# Patient Record
Sex: Male | Born: 1960 | Race: White | Hispanic: No | Marital: Married | Smoking: Never smoker
Health system: Southern US, Community
[De-identification: ages and names within clinical notes are randomized; demographics above are authoritative.]

## PROBLEM LIST (undated history)

## (undated) DIAGNOSIS — K219 Gastro-esophageal reflux disease without esophagitis: Secondary | ICD-10-CM

## (undated) DIAGNOSIS — I719 Aortic aneurysm of unspecified site, without rupture: Secondary | ICD-10-CM

## (undated) DIAGNOSIS — I639 Cerebral infarction, unspecified: Secondary | ICD-10-CM

## (undated) DIAGNOSIS — N289 Disorder of kidney and ureter, unspecified: Secondary | ICD-10-CM

## (undated) DIAGNOSIS — I10 Essential (primary) hypertension: Secondary | ICD-10-CM

## (undated) DIAGNOSIS — I2510 Atherosclerotic heart disease of native coronary artery without angina pectoris: Secondary | ICD-10-CM

## (undated) HISTORY — PX: CORONARY ANEURYSM REPAIR: SHX603

## (undated) HISTORY — PX: AORTIC VALVE REPAIR: SHX6306

## (undated) HISTORY — PX: CARDIAC SURGERY: SHX584

---

## 1999-06-18 ENCOUNTER — Emergency Department (HOSPITAL_COMMUNITY): Admission: EM | Admit: 1999-06-18 | Discharge: 1999-06-18 | Payer: Self-pay | Admitting: Emergency Medicine

## 2009-06-29 ENCOUNTER — Emergency Department (HOSPITAL_BASED_OUTPATIENT_CLINIC_OR_DEPARTMENT_OTHER): Admission: EM | Admit: 2009-06-29 | Discharge: 2009-06-29 | Payer: Self-pay | Admitting: Emergency Medicine

## 2009-06-29 ENCOUNTER — Ambulatory Visit: Payer: Self-pay | Admitting: Diagnostic Radiology

## 2009-07-17 ENCOUNTER — Ambulatory Visit (HOSPITAL_COMMUNITY): Admission: RE | Admit: 2009-07-17 | Discharge: 2009-07-17 | Payer: Self-pay | Admitting: Cardiology

## 2009-07-17 ENCOUNTER — Encounter (INDEPENDENT_AMBULATORY_CARE_PROVIDER_SITE_OTHER): Payer: Self-pay | Admitting: Cardiology

## 2009-07-17 ENCOUNTER — Ambulatory Visit (HOSPITAL_COMMUNITY): Admission: RE | Admit: 2009-07-17 | Discharge: 2009-07-17 | Payer: Self-pay | Admitting: Cardiovascular Disease

## 2010-12-09 ENCOUNTER — Emergency Department (HOSPITAL_COMMUNITY): Payer: 59

## 2010-12-09 ENCOUNTER — Emergency Department (HOSPITAL_COMMUNITY)
Admission: EM | Admit: 2010-12-09 | Discharge: 2010-12-09 | Disposition: A | Discharge status: Home / Self Care | Payer: 59 | Attending: Emergency Medicine | Admitting: Emergency Medicine

## 2010-12-09 DIAGNOSIS — I359 Nonrheumatic aortic valve disorder, unspecified: Secondary | ICD-10-CM | POA: Insufficient documentation

## 2010-12-09 DIAGNOSIS — I10 Essential (primary) hypertension: Secondary | ICD-10-CM | POA: Insufficient documentation

## 2010-12-09 DIAGNOSIS — K219 Gastro-esophageal reflux disease without esophagitis: Secondary | ICD-10-CM | POA: Insufficient documentation

## 2010-12-09 DIAGNOSIS — R42 Dizziness and giddiness: Secondary | ICD-10-CM | POA: Insufficient documentation

## 2010-12-09 DIAGNOSIS — I498 Other specified cardiac arrhythmias: Secondary | ICD-10-CM | POA: Insufficient documentation

## 2010-12-09 LAB — BASIC METABOLIC PANEL
BUN: 14 mg/dL (ref 6–23)
CO2: 27 mEq/L (ref 19–32)
Calcium: 9.8 mg/dL (ref 8.4–10.5)
Chloride: 102 mEq/L (ref 96–112)
Creatinine, Ser: 1.3 mg/dL (ref 0.4–1.5)

## 2010-12-09 LAB — DIFFERENTIAL
Basophils Absolute: 0 10*3/uL (ref 0.0–0.1)
Basophils Relative: 0 % (ref 0–1)
Eosinophils Absolute: 0.1 10*3/uL (ref 0.0–0.7)
Monocytes Relative: 7 % (ref 3–12)
Neutrophils Relative %: 69 % (ref 43–77)

## 2010-12-09 LAB — CBC
MCH: 31.3 pg (ref 26.0–34.0)
Platelets: 212 10*3/uL (ref 150–400)
RBC: 5.02 MIL/uL (ref 4.22–5.81)

## 2010-12-09 LAB — POCT CARDIAC MARKERS
Myoglobin, poc: 58.6 ng/mL (ref 12–200)
Troponin i, poc: <0.05 ng/mL (ref 0.00–0.09)

## 2011-02-05 LAB — POCT I-STAT 3, ART BLOOD GAS (G3+)
Acid-Base Excess: 1 mmol/L (ref 0.0–2.0)
O2 Saturation: 95 %
TCO2: 28 mmol/L (ref 0–100)

## 2011-02-05 LAB — BASIC METABOLIC PANEL
CO2: 26 mEq/L (ref 19–32)
Calcium: 9.8 mg/dL (ref 8.4–10.5)
Creatinine, Ser: 1.07 mg/dL (ref 0.4–1.5)
GFR calc Af Amer: >60 mL/min (ref >60)
Glucose, Bld: 123 mg/dL — ABNORMAL HIGH (ref 70–99)

## 2011-02-05 LAB — PROTIME-INR
INR: 1 (ref 0.00–1.49)
Prothrombin Time: 13.4 seconds (ref 11.6–15.2)

## 2011-02-05 LAB — CBC
MCHC: 34.4 g/dL (ref 30.0–36.0)
RDW: 12.6 % (ref 11.5–15.5)

## 2011-02-05 LAB — POCT I-STAT 3, VENOUS BLOOD GAS (G3P V): pH, Ven: 7.366 — ABNORMAL HIGH (ref 7.250–7.300)

## 2011-02-06 LAB — BASIC METABOLIC PANEL
Chloride: 103 mEq/L (ref 96–112)
Creatinine, Ser: 1.3 mg/dL (ref 0.4–1.5)
GFR calc Af Amer: >60 mL/min (ref >60)
Potassium: 4.2 mEq/L (ref 3.5–5.1)

## 2011-02-06 LAB — CBC
MCV: 89.3 fL (ref 78.0–100.0)
RBC: 5.07 MIL/uL (ref 4.22–5.81)
WBC: 8.9 10*3/uL (ref 4.0–10.5)

## 2011-02-06 LAB — DIFFERENTIAL
Eosinophils Absolute: 0.2 10*3/uL (ref 0.0–0.7)
Lymphs Abs: 1.4 10*3/uL (ref 0.7–4.0)
Monocytes Relative: 7 % (ref 3–12)
Neutrophils Relative %: 75 % (ref 43–77)

## 2011-02-06 LAB — POCT CARDIAC MARKERS
CKMB, poc: <1 ng/mL — ABNORMAL LOW (ref 1.0–8.0)
Myoglobin, poc: 56.4 ng/mL (ref 12–200)
Troponin i, poc: <0.05 ng/mL (ref 0.00–0.09)

## 2018-01-30 ENCOUNTER — Emergency Department (HOSPITAL_BASED_OUTPATIENT_CLINIC_OR_DEPARTMENT_OTHER)
Admission: EM | Admit: 2018-01-30 | Discharge: 2018-01-30 | Disposition: A | Discharge status: Home / Self Care | Payer: 59 | Attending: Emergency Medicine | Admitting: Emergency Medicine

## 2018-01-30 ENCOUNTER — Emergency Department (HOSPITAL_BASED_OUTPATIENT_CLINIC_OR_DEPARTMENT_OTHER): Payer: 59

## 2018-01-30 ENCOUNTER — Other Ambulatory Visit: Payer: Self-pay

## 2018-01-30 ENCOUNTER — Encounter (HOSPITAL_BASED_OUTPATIENT_CLINIC_OR_DEPARTMENT_OTHER): Payer: Self-pay | Admitting: Emergency Medicine

## 2018-01-30 DIAGNOSIS — R011 Cardiac murmur, unspecified: Secondary | ICD-10-CM | POA: Diagnosis not present

## 2018-01-30 DIAGNOSIS — R079 Chest pain, unspecified: Secondary | ICD-10-CM | POA: Diagnosis present

## 2018-01-30 DIAGNOSIS — Z952 Presence of prosthetic heart valve: Secondary | ICD-10-CM | POA: Diagnosis not present

## 2018-01-30 DIAGNOSIS — R0602 Shortness of breath: Secondary | ICD-10-CM | POA: Diagnosis not present

## 2018-01-30 DIAGNOSIS — R51 Headache: Secondary | ICD-10-CM | POA: Insufficient documentation

## 2018-01-30 DIAGNOSIS — Z79899 Other long term (current) drug therapy: Secondary | ICD-10-CM | POA: Diagnosis not present

## 2018-01-30 DIAGNOSIS — Z8673 Personal history of transient ischemic attack (TIA), and cerebral infarction without residual deficits: Secondary | ICD-10-CM | POA: Diagnosis not present

## 2018-01-30 DIAGNOSIS — Z7982 Long term (current) use of aspirin: Secondary | ICD-10-CM | POA: Insufficient documentation

## 2018-01-30 DIAGNOSIS — I10 Essential (primary) hypertension: Secondary | ICD-10-CM | POA: Diagnosis not present

## 2018-01-30 DIAGNOSIS — R61 Generalized hyperhidrosis: Secondary | ICD-10-CM | POA: Insufficient documentation

## 2018-01-30 DIAGNOSIS — K5900 Constipation, unspecified: Secondary | ICD-10-CM | POA: Diagnosis not present

## 2018-01-30 DIAGNOSIS — I2510 Atherosclerotic heart disease of native coronary artery without angina pectoris: Secondary | ICD-10-CM | POA: Diagnosis not present

## 2018-01-30 DIAGNOSIS — Z7901 Long term (current) use of anticoagulants: Secondary | ICD-10-CM | POA: Diagnosis not present

## 2018-01-30 DIAGNOSIS — R519 Headache, unspecified: Secondary | ICD-10-CM

## 2018-01-30 HISTORY — DX: Aortic aneurysm of unspecified site, without rupture: I71.9

## 2018-01-30 HISTORY — DX: Atherosclerotic heart disease of native coronary artery without angina pectoris: I25.10

## 2018-01-30 HISTORY — DX: Gastro-esophageal reflux disease without esophagitis: K21.9

## 2018-01-30 HISTORY — DX: Disorder of kidney and ureter, unspecified: N28.9

## 2018-01-30 HISTORY — DX: Essential (primary) hypertension: I10

## 2018-01-30 HISTORY — DX: Cerebral infarction, unspecified: I63.9

## 2018-01-30 LAB — TROPONIN I: Troponin I: <0.03 ng/mL (ref <0.03)

## 2018-01-30 LAB — HEPATIC FUNCTION PANEL
ALBUMIN: 4.5 g/dL (ref 3.5–5.0)
ALT: 36 U/L (ref 17–63)
AST: 30 U/L (ref 15–41)
Alkaline Phosphatase: 75 U/L (ref 38–126)
Bilirubin, Direct: 0.1 mg/dL (ref 0.1–0.5)
Indirect Bilirubin: 0.7 mg/dL (ref 0.3–0.9)
TOTAL PROTEIN: 7.8 g/dL (ref 6.5–8.1)
Total Bilirubin: 0.8 mg/dL (ref 0.3–1.2)

## 2018-01-30 LAB — BASIC METABOLIC PANEL
Anion gap: 8 (ref 5–15)
BUN: 15 mg/dL (ref 6–20)
CALCIUM: 9.7 mg/dL (ref 8.9–10.3)
CO2: 23 mmol/L (ref 22–32)
Chloride: 105 mmol/L (ref 101–111)
Creatinine, Ser: 1.05 mg/dL (ref 0.61–1.24)
Glucose, Bld: 100 mg/dL — ABNORMAL HIGH (ref 65–99)
Potassium: 4.3 mmol/L (ref 3.5–5.1)
Sodium: 136 mmol/L (ref 135–145)

## 2018-01-30 LAB — CBC
HCT: 47.1 % (ref 39.0–52.0)
Hemoglobin: 16 g/dL (ref 13.0–17.0)
MCH: 30.3 pg (ref 26.0–34.0)
MCHC: 34 g/dL (ref 30.0–36.0)
MCV: 89.2 fL (ref 78.0–100.0)
Platelets: 206 10*3/uL (ref 150–400)
RBC: 5.28 MIL/uL (ref 4.22–5.81)
RDW: 13.3 % (ref 11.5–15.5)
WBC: 8.3 10*3/uL (ref 4.0–10.5)

## 2018-01-30 LAB — PROTIME-INR
INR: 2.35
PROTHROMBIN TIME: 25.6 s — AB (ref 11.4–15.2)

## 2018-01-30 LAB — LIPASE, BLOOD: Lipase: 30 U/L (ref 11–51)

## 2018-01-30 MED ORDER — IOPAMIDOL (ISOVUE-370) INJECTION 76%
100.0000 mL | Freq: Once | INTRAVENOUS | Status: AC | PRN
  Administered 2018-01-30: 100 mL via INTRAVENOUS

## 2018-01-30 MED ORDER — PROCHLORPERAZINE EDISYLATE 5 MG/ML IJ SOLN
10.0000 mg | Freq: Once | INTRAMUSCULAR | Status: AC
  Administered 2018-01-30: 10 mg via INTRAVENOUS
  Filled 2018-01-30: qty 2

## 2018-01-30 MED ORDER — DIPHENHYDRAMINE HCL 50 MG/ML IJ SOLN
25.0000 mg | Freq: Once | INTRAMUSCULAR | Status: AC
  Administered 2018-01-30: 25 mg via INTRAVENOUS
  Filled 2018-01-30: qty 1

## 2018-01-30 NOTE — ED Notes (Signed)
ED Provider at bedside. 

## 2018-01-30 NOTE — Discharge Instructions (Signed)
Your workup today did not show convincing evidence of a cardiac cause of your chest pain given your reassuring CT scan of your chest, lab testing, and EKG that cardiology reviewed.  After speaking with her cardiology team, they felt you are safe for discharge home to continue outpatient workup by your [facility] cardiology team.  Please do not exert yourself too hard and follow-up with them in the next few days.  Please stay hydrated.  If any symptoms change or worsen, please return to the nearest emergency department immediately.

## 2018-01-30 NOTE — ED Provider Notes (Signed)
[facility] EMERGENCY DEPARTMENT Provider Note   CSN: [id number] Arrival date & time: 01/30/18  1003     History   Chief Complaint Chief Complaint  Patient presents with  . Chest Pain    HPI Xavier Roman is a 57 y.o. male.  The history is provided by the patient and the spouse.  Chest Pain   This is a recurrent problem. The current episode started more than 2 days ago. The problem occurs constantly. The problem has not changed since onset.The pain is associated with exertion. The pain is present in the substernal region. The pain is moderate. The quality of the pain is described as exertional, heavy and pressure-like. The pain does not radiate. The symptoms are aggravated by exertion. Associated symptoms include diaphoresis, exertional chest pressure, headaches and shortness of breath. Pertinent negatives include no abdominal pain, no cough, no fever, no irregular heartbeat, no lower extremity edema, no nausea, no near-syncope, no palpitations, no sputum production, no vomiting and no weakness. He has tried nothing for the symptoms. The treatment provided no relief. Risk factors include male gender.  His past medical history is significant for CAD and strokes.    Past Medical History:  Diagnosis Date  . Coronary artery disease   . GERD (gastroesophageal reflux disease)   . Hypertension   . Renal disorder    kideny stones  . Stroke [facility](HCC)     There are no active problems to display for this patient.   Past Surgical History:  Procedure Laterality Date  . CARDIAC SURGERY          Home Medications    Prior to Admission medications   Medication Sig Start Date End Date Taking? Authorizing Provider  aspirin 81 MG chewable tablet Chew by mouth daily.   Yes [provider]  esomeprazole (NEXIUM) 40 MG capsule Take 40 mg by mouth daily at 12 noon.   Yes [provider]  ezetimibe-simvastatin (VYTORIN) 10-20 MG tablet Take 1 tablet by mouth daily.    Yes [provider]  losartan (COZAAR) 50 MG tablet Take 50 mg by mouth daily.   Yes [provider]  metoprolol succinate (TOPROL-XL) 50 MG 24 hr tablet Take 50 mg by mouth daily. Take with or immediately following a meal.   Yes [provider]  warfarin (COUMADIN) 4 MG tablet Take 4 mg by mouth daily.   Yes [provider]    Family History History reviewed. No pertinent family history.  Social History Social History   Tobacco Use  . Smoking status: Never Smoker  . Smokeless tobacco: Never Used  Substance Use Topics  . Alcohol use: Never    Frequency: Never  . Drug use: Never     Allergies   Patient has no known allergies.   Review of Systems Review of Systems  Constitutional: Positive for diaphoresis. Negative for chills, fatigue and fever.  HENT: Negative for congestion.   Eyes: Negative for visual disturbance.  Respiratory: Positive for chest tightness and shortness of breath. Negative for cough, sputum production, wheezing and stridor.   Cardiovascular: Positive for chest pain. Negative for palpitations, leg swelling and near-syncope.  Gastrointestinal: Negative for abdominal pain, constipation, diarrhea, nausea and vomiting.  Genitourinary: Negative for dysuria and flank pain.  Musculoskeletal: Negative for neck pain and neck stiffness.  Skin: Negative for wound.  Neurological: Positive for headaches. Negative for weakness and light-headedness.  Psychiatric/Behavioral: Negative for agitation and confusion.  All other systems reviewed and are negative.  Physical Exam Updated Vital Signs BP (!) 138/92 (BP Location: Left Arm)   Pulse 83   Temp 97.9 F (36.6 C) (Oral)   Resp 20   Ht 5\' 11"  (1.803 m)   Wt 88 kg (194 lb)   SpO2 100%   BMI 27.06 kg/m   Physical Exam  Constitutional: He is oriented to person, place, and time. He appears well-developed and well-nourished.  Non-toxic appearance. He does not appear ill. No  distress.  HENT:  Head: Normocephalic.  Mouth/Throat: Oropharynx is clear and moist. No oropharyngeal exudate.  Eyes: Pupils are equal, round, and reactive to light. EOM are normal.  Neck: Normal range of motion. Neck supple.  Cardiovascular: Normal rate and regular rhythm.  Murmur heard. Pulmonary/Chest: Effort normal and breath sounds normal. No respiratory distress. He has no decreased breath sounds. He has no wheezes. He has no rhonchi. He has no rales.  Abdominal: Soft. There is no tenderness.  Musculoskeletal: He exhibits no edema or tenderness.  Lymphadenopathy:    He has no cervical adenopathy.  Neurological: He is alert and oriented to person, place, and time. No cranial nerve deficit or sensory deficit. He exhibits normal muscle tone. Coordination normal.  Skin: Capillary refill takes less than 2 seconds. No erythema. No pallor.  Psychiatric: He has a normal mood and affect.  Nursing note and vitals reviewed.    ED Treatments / Results  Labs (all labs ordered are listed, but only abnormal results are displayed) Labs Reviewed  BASIC METABOLIC PANEL - Abnormal; Notable for the following components:      Result Value   Glucose, Bld 100 (*)    All other components within normal limits  PROTIME-INR - Abnormal; Notable for the following components:   Prothrombin Time 25.6 (*)    All other components within normal limits  CBC  TROPONIN I  HEPATIC FUNCTION PANEL  LIPASE, BLOOD  TROPONIN I    EKG EKG Interpretation  Date/Time:  Monday January 30 2018 10:11:41 EDT Ventricular Rate:  74 PR Interval:  168 QRS Duration: 82 QT Interval:  364 QTC Calculation: 404 R Axis:   -2 Text Interpretation:  Normal sinus rhythm Normal ECG When compared to prior, no significant changes seen.  No STEMI Confirmed by Theda Belfast ([id number]) on 01/30/2018 10:44:20 AM   Radiology Dg Chest 2 View  Result Date: 01/30/2018 CLINICAL DATA:  Intermittent headache and nose bleeds. Intermittent  chest pain. EXAM: CHEST - 2 VIEW COMPARISON:  Single-view of the chest 07/17/2009. PA and lateral chest 06/29/2009. FINDINGS: The patient has undergone median sternotomy since the prior examination. Surgical clips are also seen projecting in the right upper chest. The lungs are clear. Heart size is normal. There is no pneumothorax or pleural effusion. No acute bony abnormality. IMPRESSION: No acute disease. Electronically Signed   By: Drusilla Kanner M.D.   On: 01/30/2018 10:35   Ct Head Wo Contrast  Result Date: 01/30/2018 CLINICAL DATA:  Patient states that has had pressure and burning of mid to left side of his chest, hx of aortic valve replaced and aneurysm repaired, states that he started with dull headaches to mid frontal up to vertex of head x 1.5 weeks, states the first 2 days of the headaches he had a few nosebleeds, hx of stroke 02/02/17, also has a known clot in his head, renal disorder, GERD, coronary artery disease, no other complaints EXAM: CT HEAD WITHOUT CONTRAST TECHNIQUE: Contiguous axial images were obtained from the base of the skull through  the vertex without intravenous contrast. COMPARISON:  02/02/2017 FINDINGS: Brain: There is a well-defined area of hypoattenuation along the posterior left frontal lobe consistent with an old infarct, although this is a new finding since the prior head CT. There are no parenchymal masses or mass effect. There is no evidence of a recent infarct. There are no extra-axial masses or abnormal fluid collections. The ventricles are normal in size and configuration. There is no intracranial hemorrhage. Vascular: No hyperdense vessel or unexpected calcification. Skull: Normal. Negative for fracture or focal lesion. Sinuses/Orbits: Visualized globes and orbits are unremarkable. The visualized sinuses and mastoid air cells are clear. Other: None. IMPRESSION: 1. No acute intracranial abnormalities. 2. Chronic, small left posterior frontal lobe infarct, new since the  prior CT. Electronically Signed   By: Amie Portland M.D.   On: 01/30/2018 12:30   Ct Angio Chest/abd/pel For Dissection W And/or Wo Contrast  Result Date: 01/30/2018 CLINICAL DATA:  Chest pain, pressure and burning at mid to LEFT chest, prior aneurysm repair and AVR, headaches, epistaxis, history of stroke, coronary artery disease, hypertension, GERD EXAM: CT ANGIOGRAPHY CHEST, ABDOMEN AND PELVIS TECHNIQUE: Multidetector CT imaging through the chest, abdomen and pelvis was performed using the standard protocol during bolus administration of intravenous contrast. Multiplanar reconstructed images and MIPs were obtained and reviewed to evaluate the vascular anatomy. CONTRAST:  ISOVUE-370 IOPAMIDOL (ISOVUE-370) INJECTION 76% IV COMPARISON:  None FINDINGS: CTA CHEST FINDINGS Cardiovascular: Scattered atherosclerotic calcifications aorta and coronary arteries. No intramural hematoma identified on precontrast imaging. Prior AVR. No pericardial effusion. Normal aortic enhancement following contrast without dissection. Proximal great vessels normal appearance. Pulmonary arteries grossly patent on nondedicated exam. Mediastinum/Nodes: Base of cervical region normal appearance. Esophagus normal appearance. No thoracic adenopathy. Lungs/Pleura: Tiny 1-2 mm LEFT lower lobe nodule image 45. Single tiny focus of subsegmental atelectasis in LEFT lower lobe. Remaining lungs clear. No infiltrate, pleural effusion or pneumothorax. No endobronchial lesions seen. Musculoskeletal: No acute osseous findings Review of the MIP images confirms the above findings. CTA ABDOMEN AND PELVIS FINDINGS VASCULAR Aorta: Normal caliber with minimal scattered atherosclerotic calcifications. No aneurysm or dissection. Celiac: Patent, unremarkable SMA: Patent, unremarkable Renals: Mild plaque formation at origin of RIGHT renal artery, estimated 50% narrowing. Accessory renal artery to upper pole RIGHT kidney. IMA: Significant calcific plaque at  the origin of the IMA with high-grade narrowing Inflow: Scattered atherosclerotic plaques in the common iliac arteries without aneurysm Veins: Unopacified on CTA imaging, grossly normal caliber Review of the MIP images confirms the above findings. NON-VASCULAR Hepatobiliary: Gallbladder and liver unremarkable Pancreas: Normal appearance Spleen: Normal appearance Adrenals/Urinary Tract: Adrenal glands normal appearance. Multiple small non-obstructing calculi within RIGHT kidney. No definite renal mass, hydronephrosis or hydroureter. Bladder unremarkable Stomach/Bowel: Minimal distal colonic diverticulosis without evidence of diverticulitis. Stomach and bowel loops otherwise unremarkable for technique. Normal appendix. Lymphatic: No adenopathy Reproductive: Mild prostatic enlargement. Seminal vesicles unremarkable. Other: RIGHT inguinal hernia containing fat. No free air or free fluid. Musculoskeletal: Degenerative disc disease changes L5-S1. Review of the MIP images confirms the above findings. IMPRESSION: Scattered atherosclerotic calcifications of the aorta, coronary arteries, RIGHT renal artery origin, and origin of IMA. No evidence of aortic aneurysm or dissection. Probable high-grade narrowing of proximal IMA. Nonobstructing RIGHT renal calculi. Mild prostatic enlargement. RIGHT inguinal hernia containing fat. 1-2 mm LEFT lower lobe nodule, recommendation below. No follow-up needed if patient is low-risk. Non-contrast chest CT can be considered in 12 months if patient is high-risk. This recommendation follows the consensus statement: Guidelines for  Management of Incidental Pulmonary Nodules Detected on CT Images: From the Fleischner Society 2017; Radiology 2017; 284:228-243. Electronically Signed   By: Ulyses Southward M.D.   On: 01/30/2018 12:50    Procedures Procedures (including critical care time)  Medications Ordered in ED Medications  iopamidol (ISOVUE-370) 76 % injection 100 mL (100 mLs Intravenous  Contrast Given 01/30/18 1204)  prochlorperazine (COMPAZINE) injection 10 mg (10 mg Intravenous Given 01/30/18 1356)  diphenhydrAMINE (BENADRYL) injection 25 mg (25 mg Intravenous Given 01/30/18 1356)     Initial Impression / Assessment and Plan / ED Course  I have reviewed the triage vital signs and the nursing notes.  Pertinent labs & imaging results that were available during my care of the patient were reviewed by me and considered in my medical decision making (see chart for details).     Xavier Roman is a 57 y.o. 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  Patient reports that he has been having headache for the last few days.  He describes it as a moderate headache that is 6 out of 10 in severity.  It is throbbing and in the front of his head.  He reports mild headaches in the past but this is worse.  He reports that he has had some chest pain as well for the last few days.  He reports it is exertional and is central in his chest.  It is nonradiating.  It is associated with some shortness of breath and diaphoresis but no nausea or vomiting.  He reports it does not radiate into his arms or jaw.  He reports some constipation but no diarrhea.  He denies any cough, congestion, fevers, or chills.  No urinary symptoms.  No recent trauma.  Patient is concerned because he thinks the pain feels like when he had his aorta problem.  On exam, patient has a click aortic valve sound consistent with his aortic valve replacement.  Murmur also appreciated.  Lungs were clear with no wheezing.  No chest tenderness.  Abdomen nontender.  Back nontender.  No focal neurologic deficits aside from some stuttering and speech which she reports is from his prior stroke.  No right-sided  weakness which he reports is improving.  No numbness or tingling present.  Given patient's history of Coumadin use, old stroke and headache, CT of the head was obtained showing no evidence of intracranial hemorrhage.  The stroke was from last year and is likely the abnormality seen on the CT.  CT of the chest was also ordered to look at the aorta given his reported similarity and pain to his aortic aneurysm problem.  Aorta CT did not show evidence of new dissection or aneurysm.  There was evidence of other findings which the patient was informed of.  Proximal IMA stenosis was seen however patient denies any of the symptoms from that.  Laboratory testing showed negative initial troponin and his INR was 2.35.  Just below his goal of 2.5-3.5.  Hepatic function normal lipase normal.  CBC and BMP otherwise unremarkable.  Cardiology was called given the patient's heart score of 5 and his report of pressure-like chest pain.  Cardiology looked at the EKG and his findings.  They felt the patient was stable for discharge home if he was delta troponin negative.    Patient  was agreeable to the plan.  Troponin was negative with elevation.  Patient will be discharged home with plans to follow-up with his PCP and his [facility] team for further management of his chest pain.  Patient understood return precautions.  Patient reports headache and significant improved after headache cocktail.    Patient understood return precautions and was discharged in good condition.   Final Clinical Impressions(s) / ED Diagnoses   Final diagnoses:  Nonspecific chest pain  Acute nonintractable headache, unspecified headache type    ED Discharge Orders    None      Clinical Impression: 1. Nonspecific chest pain   2. Acute nonintractable headache, unspecified headache type     Disposition: Discharge  Condition: Good  I have discussed the results, Dx and Tx plan with the pt(& family if present). He/she/they expressed  understanding and agree(s) with the plan. Discharge instructions discussed at great length. Strict return precautions discussed and pt &/or family have verbalized understanding of the instructions. No further questions at time of discharge.    Discharge Medication List as of 01/30/2018  3:18 PM      Follow Up: Karle Plumber, MD [phone] [facility] CT [place] [address] [phone]     [facility] EMERGENCY DEPARTMENT [address] [id number] [place] [address] [phone]    Your [facility] team        Kayen Grabel, Canary Brim, MD 01/31/18 [phone]

## 2018-01-30 NOTE — ED Triage Notes (Signed)
Patient reports intermittent headache and nose bleeds. The patient also reports intermittent chest pain. The patient reports that he is having similar pain as when he had an aneurism years ago.

## 2019-02-27 IMAGING — DX DG CHEST 2V
2 series · 2 of 2 positions shown · non-contrast
Comparison: Single-view of the chest 07/17/2009. PA and lateral
chest 06/29/2009.

CLINICAL DATA: Intermittent headache and nose bleeds. Intermittent
chest pain.

EXAM:
CHEST - 2 VIEW

[chest pa]
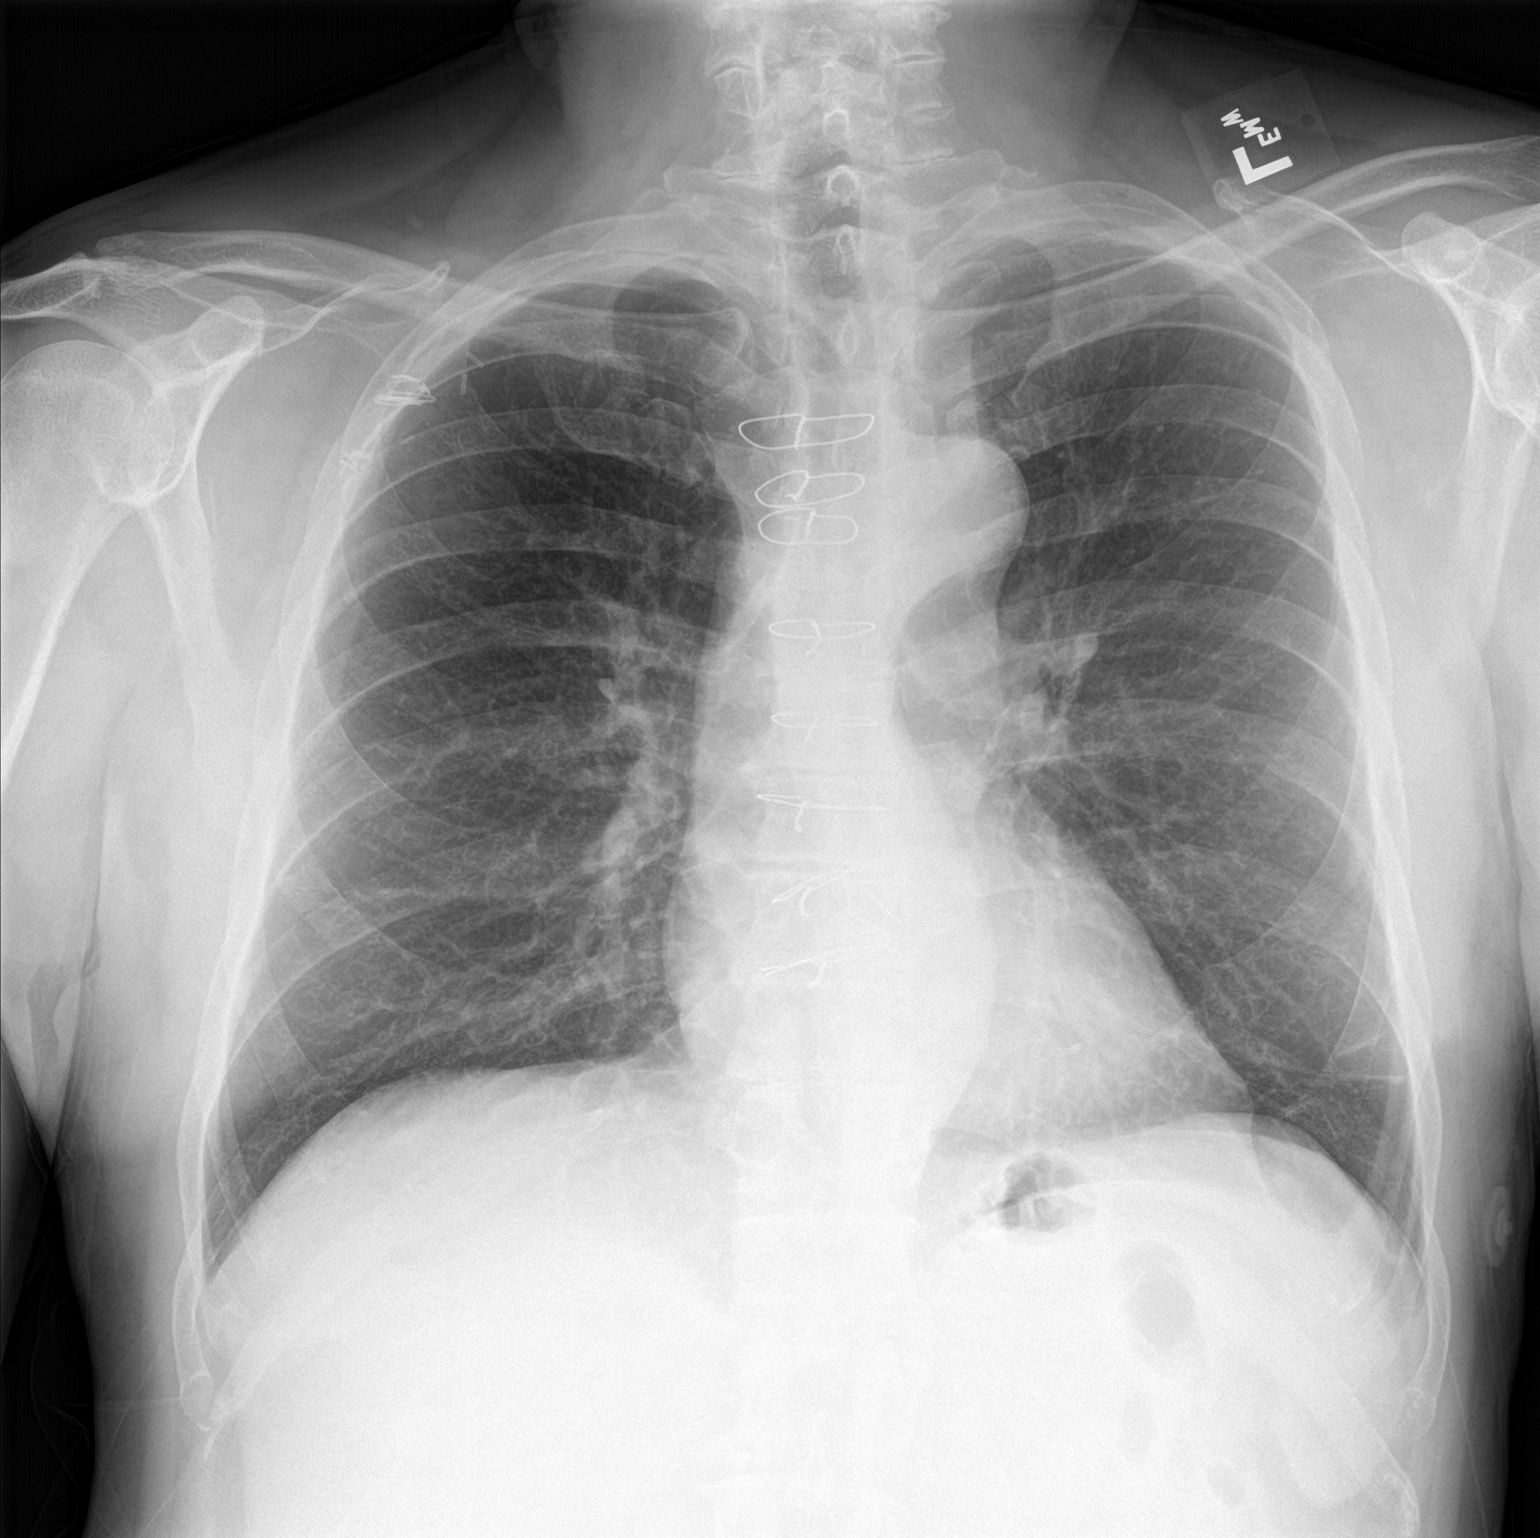

[chest lat]
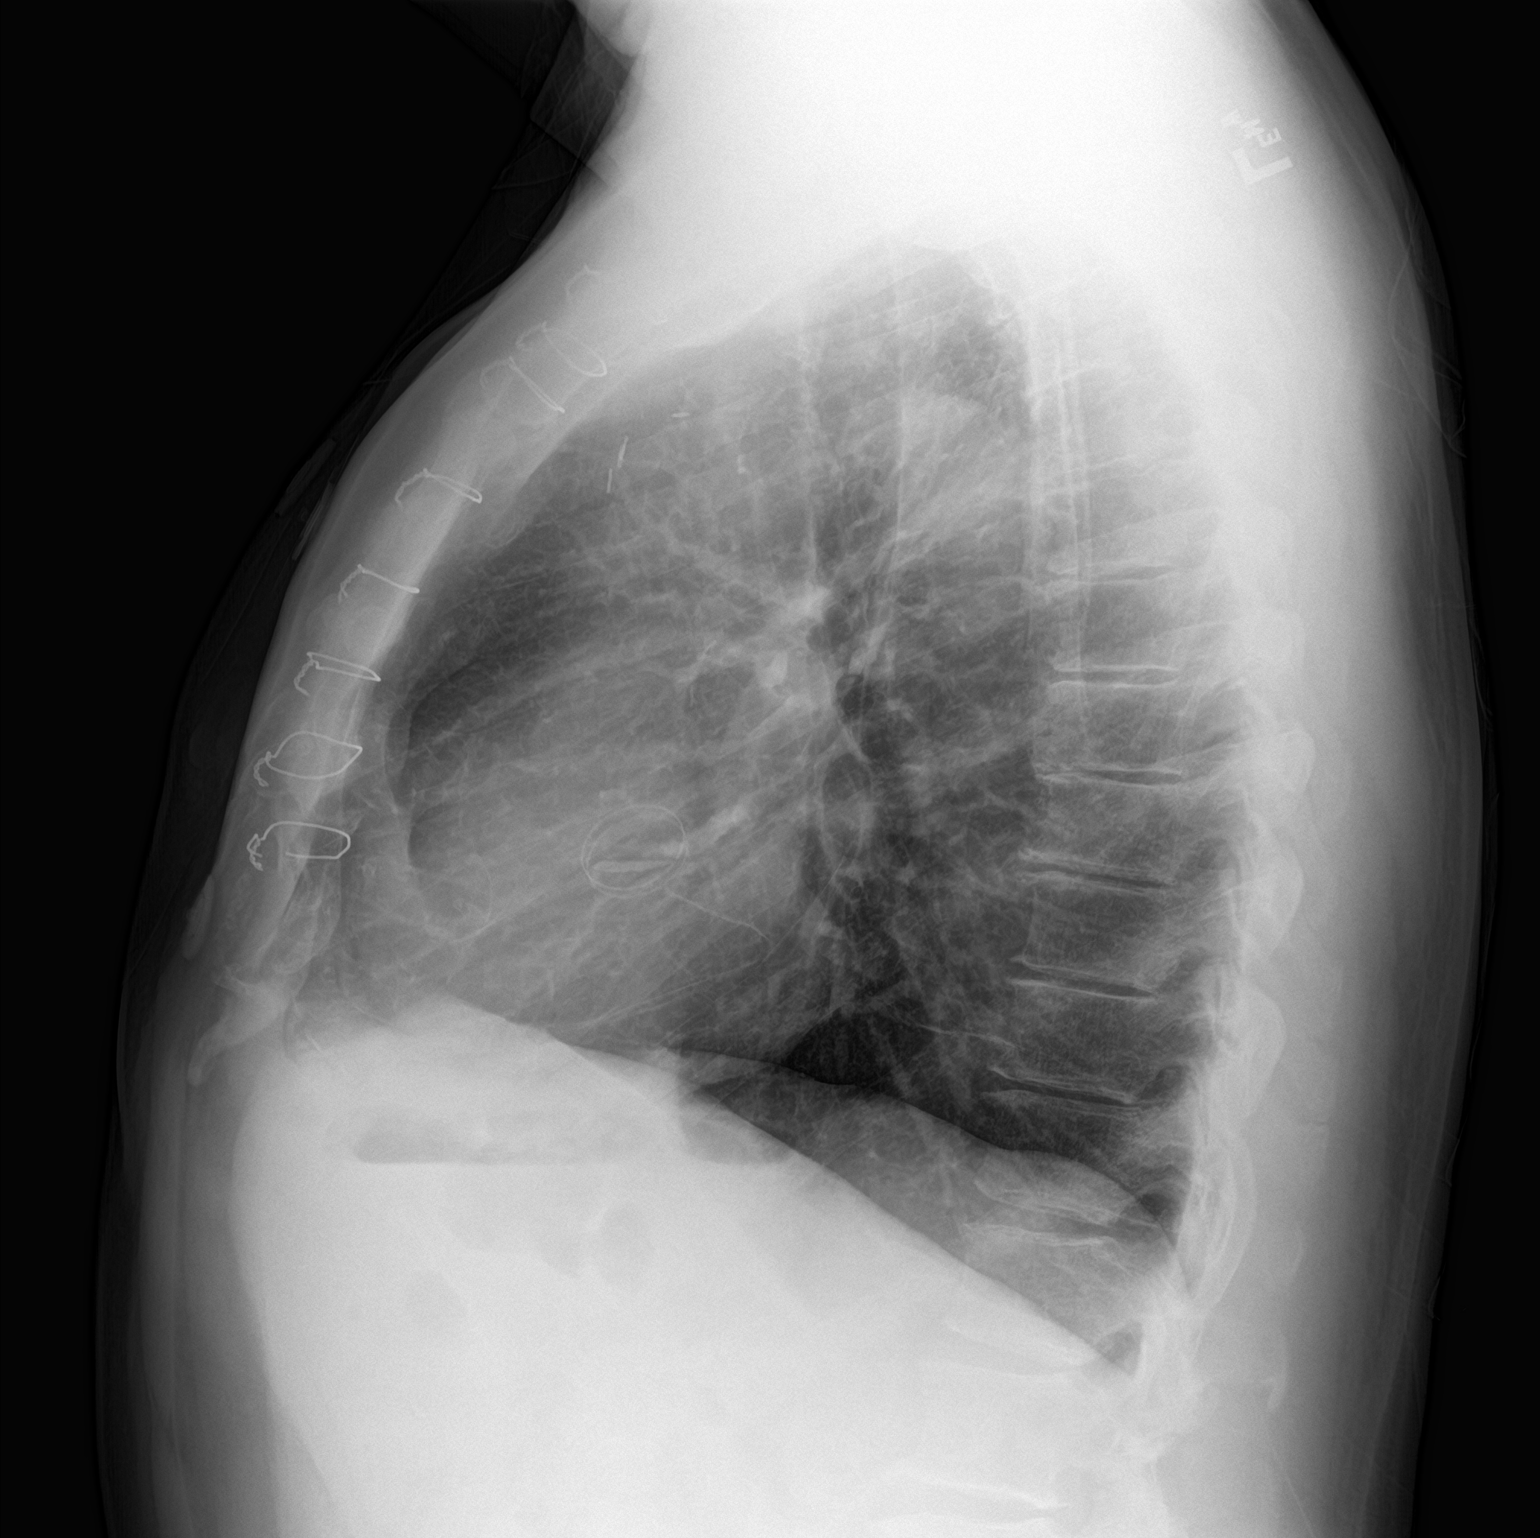

[2 of 2 positions shown; findings below may reference images not displayed]

FINDINGS: The patient has undergone median sternotomy since the prior
examination. Surgical clips are also seen projecting in the right
upper chest. The lungs are clear. Heart size is normal. There is no
pneumothorax or pleural effusion. No acute bony abnormality.
IMPRESSION: No acute disease.

## 2019-02-27 IMAGING — CT CT ANGIO CHEST-ABD-PELV FOR DISSECTION W/ AND WO/W CM
2 of 9 series · 11 of 36 positions shown, 15 images · IV contrast (APPLIED)
Comparison: None

CLINICAL DATA: Chest pain, pressure and burning at mid to LEFT
chest, prior aneurysm repair and AVR, headaches, epistaxis, history
of stroke, coronary artery disease, hypertension, GERD

EXAM:
CT ANGIOGRAPHY CHEST, ABDOMEN AND PELVIS
TECHNIQUE: Multidetector CT imaging through the chest, abdomen and pelvis was
performed using the standard protocol during bolus administration of
intravenous contrast. Multiplanar reconstructed images and MIPs were
obtained and reviewed to evaluate the vascular anatomy.
CONTRAST:  100mL 1DGW08-1DA IOPAMIDOL (1DGW08-1DA) INJECTION 76% IV

[Series 4: axial arterial · axial · arterial · 0.78mm/px · z∈[-665,-80]mm · 10 of 225 slices shown, 13 images]
[im 15/225  mediastinal]
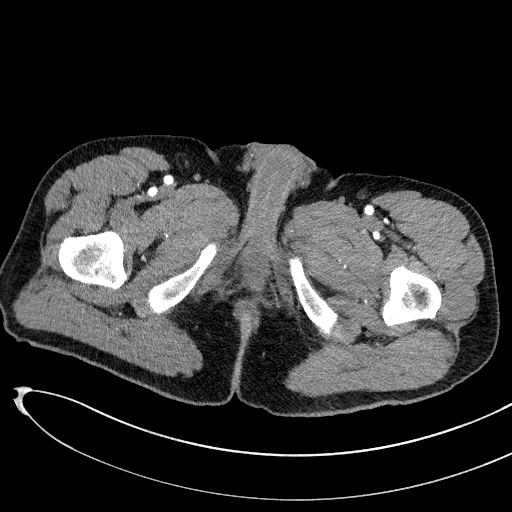
[im 15/225  bone]
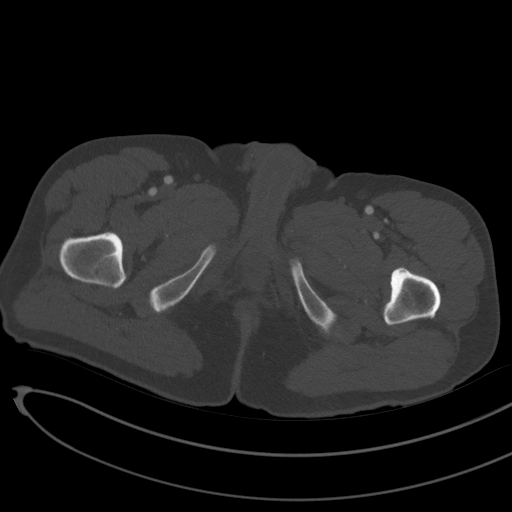
[im 45/225  mediastinal]
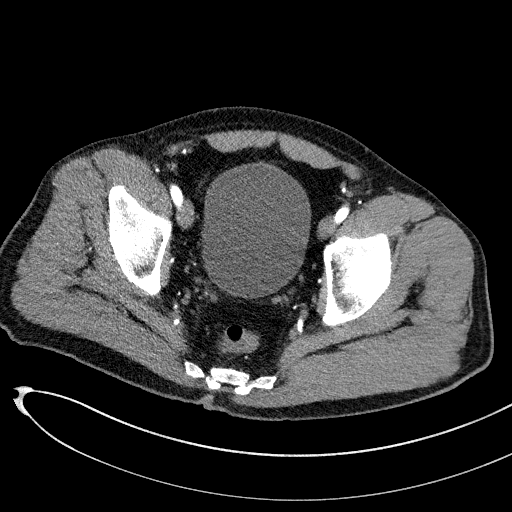
[im 75/225  mediastinal]
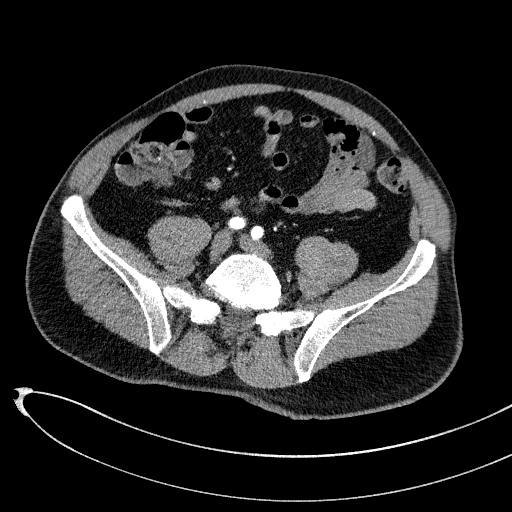
[im 105/225  mediastinal]
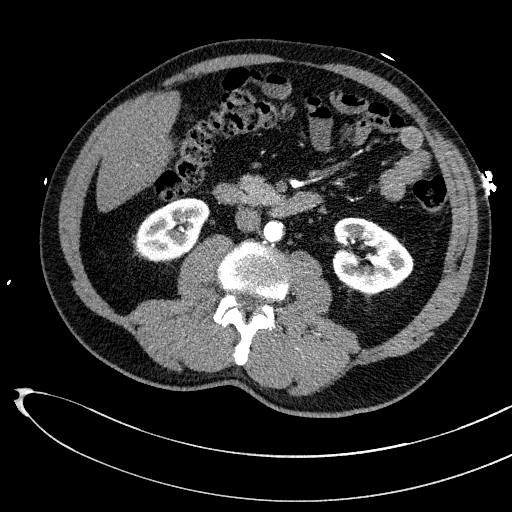
[im 120/225  mediastinal]
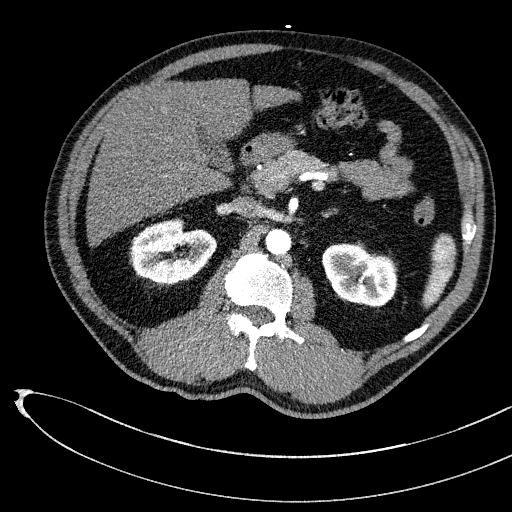
[im 150/225  mediastinal]
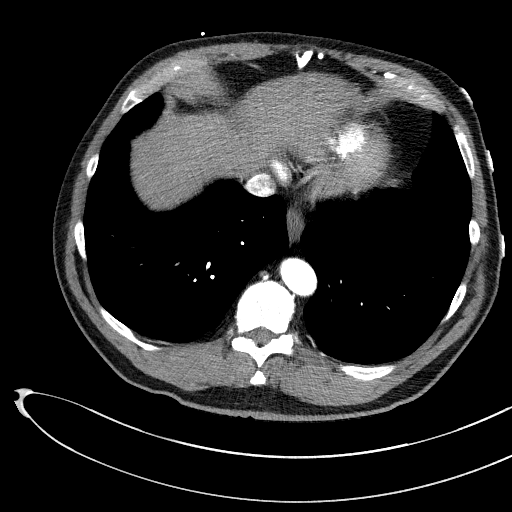
[im 165/225  lung]
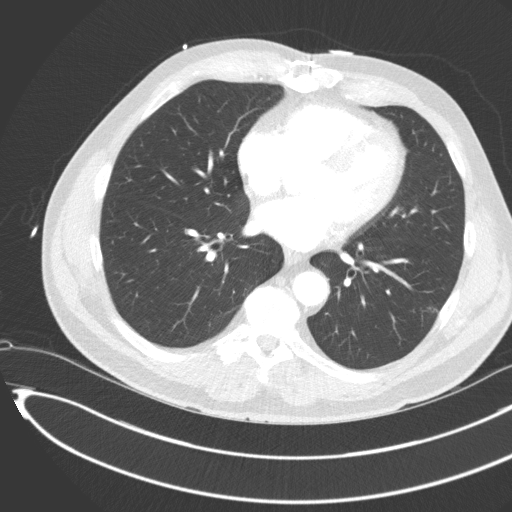
[im 180/225  mediastinal]
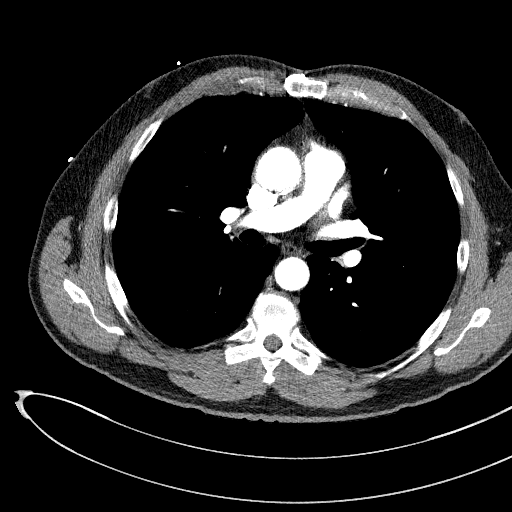
[im 180/225  lung]
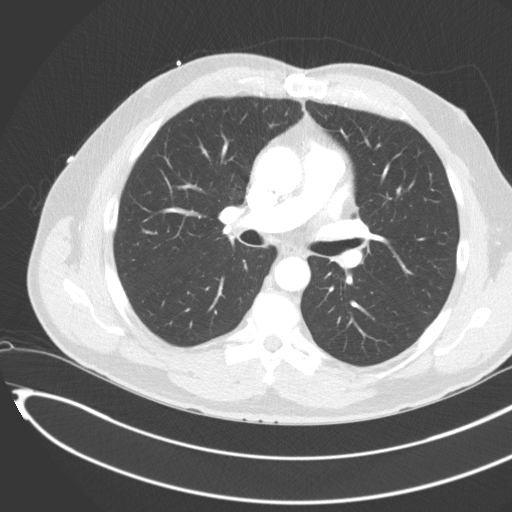
[im 195/225  lung]
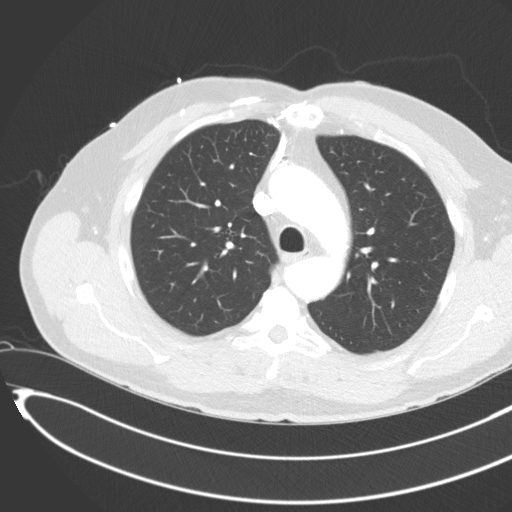
[im 210/225  mediastinal]
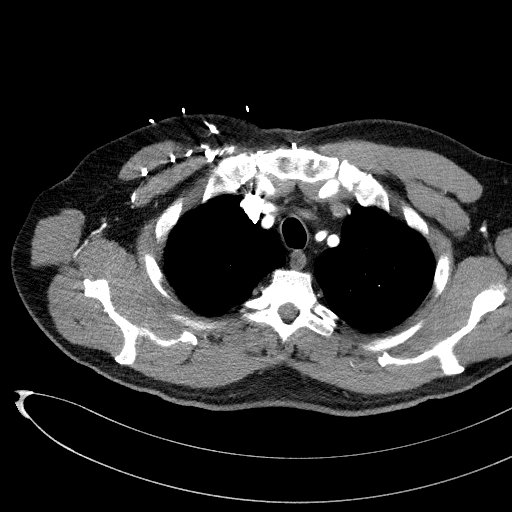
[im 210/225  lung]
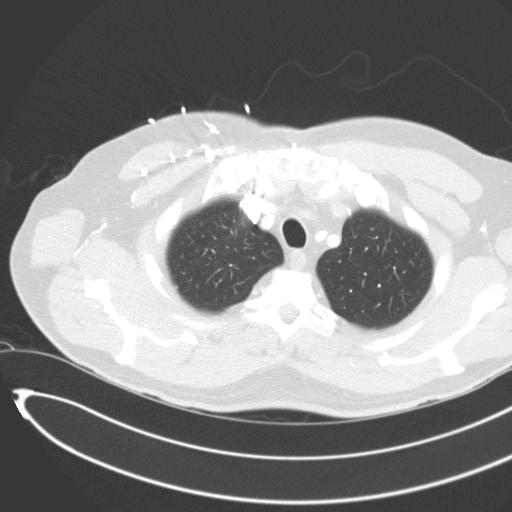

[Series 6: coronals · coronal · 0.84mm/px · 1 of 157 slices shown, 2 images]
[im 79/157  mediastinal]
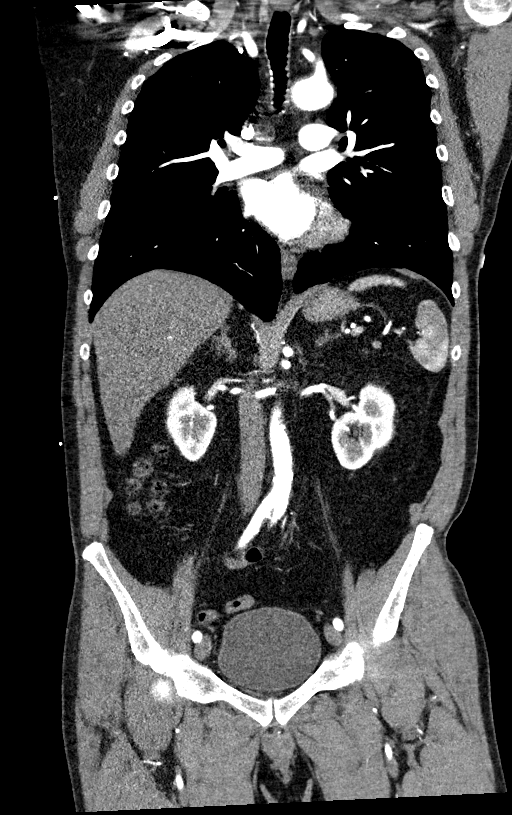
[im 79/157  bone]
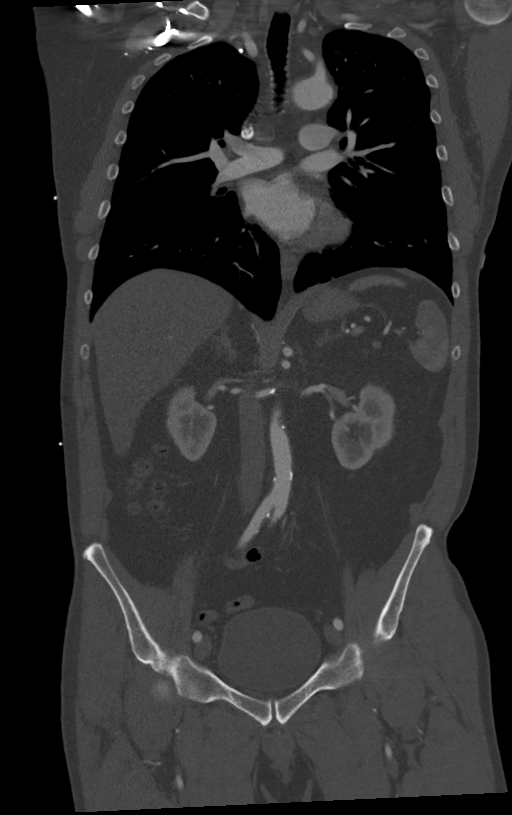

[11 of 36 positions shown; findings below may reference images not displayed]

FINDINGS: CTA CHEST FINDINGS

Cardiovascular: Scattered atherosclerotic calcifications aorta and
coronary arteries. No intramural hematoma identified on precontrast
imaging. Prior AVR. No pericardial effusion. Normal aortic
enhancement following contrast without dissection. Proximal great
vessels normal appearance. Pulmonary arteries grossly patent on
nondedicated exam.

Mediastinum/Nodes: Base of cervical region normal appearance.
Esophagus normal appearance. No thoracic adenopathy.

Lungs/Pleura: Tiny 1-2 mm LEFT lower lobe nodule image 45. Single
tiny focus of subsegmental atelectasis in LEFT lower lobe. Remaining
lungs clear. No infiltrate, pleural effusion or pneumothorax. No
endobronchial lesions seen.

Musculoskeletal: No acute osseous findings

Review of the MIP images confirms the above findings.

CTA ABDOMEN AND PELVIS FINDINGS

VASCULAR

Aorta: Normal caliber with minimal scattered atherosclerotic
calcifications. No aneurysm or dissection.

Celiac: Patent, unremarkable

SMA: Patent, unremarkable

Renals: Mild plaque formation at origin of RIGHT renal artery,
estimated 50% narrowing. Accessory renal artery to upper pole RIGHT
kidney.

IMA: Significant calcific plaque at the origin of the IMA with
high-grade narrowing

Inflow: Scattered atherosclerotic plaques in the common iliac
arteries without aneurysm

Veins: Unopacified on CTA imaging, grossly normal caliber

Review of the MIP images confirms the above findings.

NON-VASCULAR

Hepatobiliary: Gallbladder and liver unremarkable

Pancreas: Normal appearance

Spleen: Normal appearance

Adrenals/Urinary Tract: Adrenal glands normal appearance. Multiple
small non-obstructing calculi within RIGHT kidney. No definite renal
mass, hydronephrosis or hydroureter. Bladder unremarkable

Stomach/Bowel: Minimal distal colonic diverticulosis without
evidence of diverticulitis. Stomach and bowel loops otherwise
unremarkable for technique. Normal appendix.

Lymphatic: No adenopathy

Reproductive: Mild prostatic enlargement. Seminal vesicles
unremarkable.

Other: RIGHT inguinal hernia containing fat. No free air or free
fluid.

Musculoskeletal: Degenerative disc disease changes L5-S1.

Review of the MIP images confirms the above findings.
IMPRESSION: Scattered atherosclerotic calcifications of the aorta, coronary
arteries, RIGHT renal artery origin, and origin of IMA.

No evidence of aortic aneurysm or dissection.

Probable high-grade narrowing of proximal IMA.

Nonobstructing RIGHT renal calculi.

Mild prostatic enlargement.

RIGHT inguinal hernia containing fat.

1-2 mm LEFT lower lobe nodule, recommendation below.

No follow-up needed if patient is low-risk. Non-contrast chest CT
can be considered in 12 months if patient is high-risk. This
recommendation follows the consensus statement: Guidelines for
Management of Incidental Pulmonary Nodules Detected on CT Images:

## 2021-03-17 ENCOUNTER — Encounter (HOSPITAL_BASED_OUTPATIENT_CLINIC_OR_DEPARTMENT_OTHER): Payer: Self-pay | Admitting: *Deleted

## 2021-03-17 ENCOUNTER — Other Ambulatory Visit: Payer: Self-pay

## 2021-03-17 ENCOUNTER — Emergency Department (HOSPITAL_BASED_OUTPATIENT_CLINIC_OR_DEPARTMENT_OTHER)
Admission: EM | Admit: 2021-03-17 | Discharge: 2021-03-17 | Disposition: A | Discharge status: Home / Self Care | Payer: 59 | Attending: Emergency Medicine | Admitting: Emergency Medicine

## 2021-03-17 DIAGNOSIS — R509 Fever, unspecified: Secondary | ICD-10-CM | POA: Diagnosis present

## 2021-03-17 DIAGNOSIS — Z20822 Contact with and (suspected) exposure to covid-19: Secondary | ICD-10-CM

## 2021-03-17 DIAGNOSIS — I2510 Atherosclerotic heart disease of native coronary artery without angina pectoris: Secondary | ICD-10-CM | POA: Insufficient documentation

## 2021-03-17 DIAGNOSIS — Z79899 Other long term (current) drug therapy: Secondary | ICD-10-CM | POA: Insufficient documentation

## 2021-03-17 DIAGNOSIS — U071 COVID-19: Secondary | ICD-10-CM | POA: Diagnosis not present

## 2021-03-17 DIAGNOSIS — Z7901 Long term (current) use of anticoagulants: Secondary | ICD-10-CM | POA: Diagnosis not present

## 2021-03-17 DIAGNOSIS — I10 Essential (primary) hypertension: Secondary | ICD-10-CM | POA: Diagnosis not present

## 2021-03-17 DIAGNOSIS — Z7982 Long term (current) use of aspirin: Secondary | ICD-10-CM | POA: Diagnosis not present

## 2021-03-17 LAB — RESP PANEL BY RT-PCR (FLU A&B, COVID) ARPGX2
Influenza A by PCR: NEGATIVE
Influenza B by PCR: NEGATIVE
SARS Coronavirus 2 by RT PCR: POSITIVE — AB

## 2021-03-17 MED ORDER — MOLNUPIRAVIR EUA 200MG CAPSULE: 4.0000 | ORAL_CAPSULE | Freq: Two times a day (BID) | ORAL | 0 refills | Status: AC

## 2021-03-17 NOTE — Discharge Instructions (Addendum)
Your COVID test is pending, you will be contacted by phone if results are positive and can also review results online.  If your test is positive you can fill and take prescription for Molnupiravir, and antiviral use to help treat COVID infection. You chest x-ray was clear. Antibiotics are not helpful in treating viral infection, the virus should run its course in about 7-10 days. Please make sure you are drinking plenty of fluids. You can treat your symptoms supportively with tylenol for fevers and pains, and over the counter cough syrups and throat lozenges to help with cough. If your symptoms are not improving please follow up with you Primary doctor.   I recommend that you purchase a home pulse ox to help better monitor your oxygen at home, if you start to have increased work of breathing or shortness of breath or your oxygen drops below 90% please immediately return to the hospital for reevaluation.  If you develop persistent fevers, shortness of breath or difficulty breathing, chest pain, severe headache and neck pain, persistent nausea and vomiting or other new or concerning symptoms return to the Emergency department.

## 2021-03-17 NOTE — ED Provider Notes (Signed)
[facility] EMERGENCY DEPARTMENT Provider Note   CSN: [id number] Arrival date & time: 03/17/21  1907     History Chief Complaint  Patient presents with  . Cough  . Generalized Body Aches  . Sore Throat    Xavier Roman is a 60 y.o. male.  Xavier Roman is a 60 y.o. male with hx of CAD, GERD, stroke, HTN, aortic aneurysm and kidney stones, who presents with fevers, chills, sore throat and headache. Patient reports symptoms started about 24 hours ago and have been persistent. Had a fever prior to arrival, but took tylenol with improvement. Has only had a mild non-productive cough. Patient denied any chest pain or shortness of breath. Reports his wife had a cough last week but no other symptoms and had negative covid test. Patient has had 2 doses of vaccine, but has not had a booster. No other aggravating or alleviating factors.        Past Medical History:  Diagnosis Date  . Aortic aneurysm (HCC)   . Coronary artery disease   . GERD (gastroesophageal reflux disease)   . Hypertension   . Renal disorder    kideny stones  . Stroke [facility])     There are no problems to display for this patient.   Past Surgical History:  Procedure Laterality Date  . AORTIC VALVE REPAIR    . CARDIAC SURGERY    . CORONARY ANEURYSM REPAIR         No family history on file.  Social History   Tobacco Use  . Smoking status: Never Smoker  . Smokeless tobacco: Never Used  Substance Use Topics  . Alcohol use: Never  . Drug use: Never    Home Medications Prior to Admission medications   Medication Sig Start Date End Date Taking? Authorizing Provider  aspirin 81 MG chewable tablet Chew by mouth daily.   Yes [provider]  esomeprazole (NEXIUM) 40 MG capsule Take 40 mg by mouth daily at 12 noon.   Yes [provider]  ezetimibe-simvastatin (VYTORIN) 10-20 MG tablet Take 1 tablet by mouth daily.   Yes [provider]  losartan (COZAAR) 50 MG tablet  Take 50 mg by mouth daily.   Yes [provider]  metoprolol succinate (TOPROL-XL) 50 MG 24 hr tablet Take 50 mg by mouth daily. Take with or immediately following a meal.   Yes [provider]  molnupiravir EUA 200 mg CAPS Take 4 capsules (800 mg total) by mouth 2 (two) times daily for 5 days. 03/17/21 03/22/21 Yes Dartha Lodge, PA-C  warfarin (COUMADIN) 4 MG tablet Take 4 mg by mouth daily.   Yes [provider]    Allergies    Patient has no known allergies.  Review of Systems   Review of Systems  Constitutional: Positive for chills and fever.  HENT: Positive for congestion and sore throat.   Respiratory: Positive for cough. Negative for shortness of breath.   Cardiovascular: Negative for chest pain.  Gastrointestinal: Negative for abdominal pain, diarrhea, nausea and vomiting.  Musculoskeletal: Positive for arthralgias and myalgias. Negative for neck pain and neck stiffness.  Skin: Negative for color change.  Neurological: Positive for headaches.  All other systems reviewed and are negative.   Physical Exam Updated Vital Signs BP 139/82   Pulse 70   Temp 99.8 F (37.7 C) (Oral)   Resp 16   Ht 5\' 11"  (1.803 m)   Wt 86.2 kg   SpO2 97%   BMI 26.50  kg/m   Physical Exam Vitals and nursing note reviewed.  Constitutional:      General: He is not in acute distress.    Appearance: He is well-developed. He is not ill-appearing or diaphoretic.  HENT:     Head: Normocephalic and atraumatic.     Nose: Rhinorrhea present.     Mouth/Throat:     Mouth: Mucous membranes are moist.     Pharynx: Oropharynx is clear.  Eyes:     General:        Right eye: No discharge.        Left eye: No discharge.  Neck:     Comments: No rigidity Cardiovascular:     Rate and Rhythm: Normal rate and regular rhythm.     Heart sounds: Normal heart sounds. No murmur heard. No friction rub. No gallop.   Pulmonary:     Effort: Pulmonary effort is normal. No respiratory  distress.     Breath sounds: Normal breath sounds.     Comments: Respirations equal and unlabored, patient able to speak in full sentences, lungs clear to auscultation bilaterally  Abdominal:     General: Bowel sounds are normal. There is no distension.     Palpations: Abdomen is soft. There is no mass.     Tenderness: There is no abdominal tenderness. There is no guarding.     Comments: Abdomen soft, nondistended, nontender to palpation in all quadrants without guarding or peritoneal signs  Musculoskeletal:        General: No deformity.     Cervical back: Neck supple.  Lymphadenopathy:     Cervical: No cervical adenopathy.  Skin:    General: Skin is warm and dry.     Capillary Refill: Capillary refill takes less than 2 seconds.  Neurological:     Mental Status: He is alert and oriented to person, place, and time.  Psychiatric:        Mood and Affect: Mood normal.        Behavior: Behavior normal.     ED Results / Procedures / Treatments   Labs (all labs ordered are listed, but only abnormal results are displayed) Labs Reviewed  RESP PANEL BY RT-PCR (FLU A&B, COVID) ARPGX2 - Abnormal; Notable for the following components:      Result Value   SARS Coronavirus 2 by RT PCR POSITIVE (*)    All other components within normal limits    EKG None  Radiology No results found.  Procedures Procedures   Medications Ordered in ED Medications - No data to display  ED Course  I have reviewed the triage vital signs and the nursing notes.  Pertinent labs & imaging results that were available during my care of the patient were reviewed by me and considered in my medical decision making (see chart for details).    MDM Rules/Calculators/A&P                         60 y.o. male presents with 1 day of fever, headaches, sore throat and body aches. Concerned for possible COVID. Unknown sick contacts. Patient has received COVID vaccines but has not had a booster. Fortunately patient is  overall well-appearing, Vitals WNL. Does report fever at home, but took tylenol with improvement. Patient with no hypoxia or increased work of breathing at rest or with activity.   Given reassuring O2 sats do not feel that chest x-ray is indicated at this time.  COVID test pending.  Patient with suspected COVID infection today but overall symptoms appear mild and evaluation has been very reassuring today. No criteria for admission at this time. Discussed appropriate quarantine at home as well as continued symptomatic treatment. Will prescribed molnupiravir for pt given his risk factors, he is not a candidate for paxlovid due to medication interaction. Encourage patient to purchase pulse ox for monitoring of O2 sats at home and discussed strict return precaution. Provided information for post-COVID care clinic as well. Strict return precautions discussed. Patient expresses understanding and agreement. Discharged home in good condition.  Khiem Vane was evaluated in Emergency Department on 03/19/2021 for the symptoms described in the history of present illness. He was evaluated in the context of the global COVID-19 pandemic, which necessitated consideration that the patient might be at risk for infection with the SARS-CoV-2 virus that causes COVID-19. Institutional protocols and algorithms that pertain to the evaluation of patients at risk for COVID-19 are in a state of rapid change based on information released by regulatory bodies including the CDC and federal and state organizations. These policies and algorithms were followed during the patient's care in the ED.    Final Clinical Impression(s) / ED Diagnoses Final diagnoses:  Suspected COVID-19 virus infection    Rx / DC Orders ED Discharge Orders         Ordered    molnupiravir EUA 200 mg CAPS  2 times daily        03/17/21 2143           Dartha Lodge, PA-C 03/20/21 1610    Gwyneth Sprout, MD 03/25/21 [phone]

## 2021-03-17 NOTE — ED Triage Notes (Signed)
Fever, sore throat, headache since last night. He took Tylenol an hour ago.

## 2021-03-19 NOTE — ED Provider Notes (Incomplete)
[facility] EMERGENCY DEPARTMENT Provider Note   CSN: [id number] Arrival date & time: 03/17/21  1907     History Chief Complaint  Patient presents with  . Cough  . Generalized Body Aches  . Sore Throat    Xavier Roman is a 59 y.o. male.  HPI     Past Medical History:  Diagnosis Date  . Aortic aneurysm (HCC)   . Coronary artery disease   . GERD (gastroesophageal reflux disease)   . Hypertension   . Renal disorder    kideny stones  . Stroke [facility])     There are no problems to display for this patient.   Past Surgical History:  Procedure Laterality Date  . AORTIC VALVE REPAIR    . CARDIAC SURGERY    . CORONARY ANEURYSM REPAIR         No family history on file.  Social History   Tobacco Use  . Smoking status: Never Smoker  . Smokeless tobacco: Never Used  Substance Use Topics  . Alcohol use: Never  . Drug use: Never    Home Medications Prior to Admission medications   Medication Sig Start Date End Date Taking? Authorizing Provider  aspirin 81 MG chewable tablet Chew by mouth daily.   Yes [provider]  esomeprazole (NEXIUM) 40 MG capsule Take 40 mg by mouth daily at 12 noon.   Yes [provider]  ezetimibe-simvastatin (VYTORIN) 10-20 MG tablet Take 1 tablet by mouth daily.   Yes [provider]  losartan (COZAAR) 50 MG tablet Take 50 mg by mouth daily.   Yes [provider]  metoprolol succinate (TOPROL-XL) 50 MG 24 hr tablet Take 50 mg by mouth daily. Take with or immediately following a meal.   Yes [provider]  molnupiravir EUA 200 mg CAPS Take 4 capsules (800 mg total) by mouth 2 (two) times daily for 5 days. 03/17/21 03/22/21 Yes Dartha Lodge, PA-C  warfarin (COUMADIN) 4 MG tablet Take 4 mg by mouth daily.   Yes [provider]    Allergies    Patient has no known allergies.  Review of Systems   Review of Systems  Constitutional: Positive for chills and fever.  HENT:  Positive for congestion and sore throat.   Respiratory: Positive for cough. Negative for shortness of breath.   Cardiovascular: Negative for chest pain.  Gastrointestinal: Negative for abdominal pain, diarrhea, nausea and vomiting.  Musculoskeletal: Positive for arthralgias and myalgias.  Skin: Negative for color change.    Physical Exam Updated Vital Signs BP 139/82   Pulse 70   Temp 99.8 F (37.7 C) (Oral)   Resp 16   Ht 5\' 11"  (1.803 m)   Wt 86.2 kg   SpO2 97%   BMI 26.50 kg/m   Physical Exam Vitals and nursing note reviewed.  Constitutional:      General: He is not in acute distress.    Appearance: He is well-developed. He is not ill-appearing or diaphoretic.  HENT:     Head: Normocephalic and atraumatic.     Nose: Rhinorrhea present.     Mouth/Throat:     Mouth: Mucous membranes are moist.     Pharynx: Oropharynx is clear.  Eyes:     General:        Right eye: No discharge.        Left eye: No discharge.  Neck:     Comments: No rigidity Cardiovascular:     Rate and Rhythm: Normal rate  and regular rhythm.     Heart sounds: Normal heart sounds. No murmur heard. No friction rub. No gallop.   Pulmonary:     Effort: Pulmonary effort is normal. No respiratory distress.     Breath sounds: Normal breath sounds.     Comments: Respirations equal and unlabored, patient able to speak in full sentences, lungs clear to auscultation bilaterally  Abdominal:     General: Bowel sounds are normal. There is no distension.     Palpations: Abdomen is soft. There is no mass.     Tenderness: There is no abdominal tenderness. There is no guarding.     Comments: Abdomen soft, nondistended, nontender to palpation in all quadrants without guarding or peritoneal signs  Musculoskeletal:        General: No deformity.     Cervical back: Neck supple.  Lymphadenopathy:     Cervical: No cervical adenopathy.  Skin:    General: Skin is warm and dry.     Capillary Refill: Capillary refill  takes less than 2 seconds.  Neurological:     Mental Status: He is alert and oriented to person, place, and time.  Psychiatric:        Mood and Affect: Mood normal.        Behavior: Behavior normal.     ED Results / Procedures / Treatments   Labs (all labs ordered are listed, but only abnormal results are displayed) Labs Reviewed  RESP PANEL BY RT-PCR (FLU A&B, COVID) ARPGX2 - Abnormal; Notable for the following components:      Result Value   SARS Coronavirus 2 by RT PCR POSITIVE (*)    All other components within normal limits    EKG None  Radiology No results found.  Procedures Procedures {Remember to document critical care time when appropriate:1}  Medications Ordered in ED Medications - No data to display  ED Course  I have reviewed the triage vital signs and the nursing notes.  Pertinent labs & imaging results that were available during my care of the patient were reviewed by me and considered in my medical decision making (see chart for details).    MDM Rules/Calculators/A&P                         60 y.o. male presents with 1 day of fever, headaches, sore throat and body aches. Concerned for possible COVID. Unknown sick contacts. Patient has received COVID vaccines but has not had a booster. Fortunately patient is overall well-appearing, Vitals WNL. Does report fever at home, but took tylenol with improvement. Patient with no hypoxia or increased work of breathing at rest or with activity.   Given reassuring O2 sats do not feel that chest x-ray is indicated at this time.  COVID test pending.  Patient with suspected COVID infection today but overall symptoms appear mild and evaluation has been very reassuring today. No criteria for admission at this time. Discussed appropriate quarantine at home as well as continued symptomatic treatment. Will prescribed molnupiravir for pt given his risk factors, he is not a candidate for paxlovid due to medication interaction.  Encourage patient to purchase pulse ox for monitoring of O2 sats at home and discussed strict return precaution. Provided information for post-COVID care clinic as well. Strict return precautions discussed. Patient expresses understanding and agreement. Discharged home in good condition.  Xavier Roman was evaluated in Emergency Department on 03/19/2021 for the symptoms described in the history of present illness. He  was evaluated in the context of the global COVID-19 pandemic, which necessitated consideration that the patient might be at risk for infection with the SARS-CoV-2 virus that causes COVID-19. Institutional protocols and algorithms that pertain to the evaluation of patients at risk for COVID-19 are in a state of rapid change based on information released by regulatory bodies including the CDC and federal and state organizations. These policies and algorithms were followed during the patient's care in the ED.    Final Clinical Impression(s) / ED Diagnoses Final diagnoses:  Suspected COVID-19 virus infection    Rx / DC Orders ED Discharge Orders         Ordered    molnupiravir EUA 200 mg CAPS  2 times daily        03/17/21 2143
